# Patient Record
Sex: Male | Born: 1970 | Race: White | Hispanic: No | Marital: Married | Smoking: Never smoker
Health system: Southern US, Community
[De-identification: ages and names within clinical notes are randomized; demographics above are authoritative.]

## PROBLEM LIST (undated history)

## (undated) HISTORY — PX: APPENDECTOMY: SHX54

---

## 2008-03-17 ENCOUNTER — Ambulatory Visit: Payer: Self-pay | Admitting: Internal Medicine

## 2008-04-23 ENCOUNTER — Ambulatory Visit: Payer: Self-pay | Admitting: Family Medicine

## 2008-05-04 ENCOUNTER — Emergency Department: Payer: Self-pay | Admitting: Emergency Medicine

## 2009-04-30 IMAGING — CR DG CHEST 1V PORT
1 series · 1 of 1 positions shown · non-contrast
Comparison: none

REASON FOR EXAM: Chest Pain
COMMENTS:

PROCEDURE:     DXR - DXR PORTABLE CHEST SINGLE VIEW  - May 04, 2008 [DATE]
RESULT:     The lungs are adequately inflated and clear. The heart is normal
in size. The pulmonary vascularity is not engorged. I see no pleural
effusion. The mediastinum is not widened.

[view not recorded]
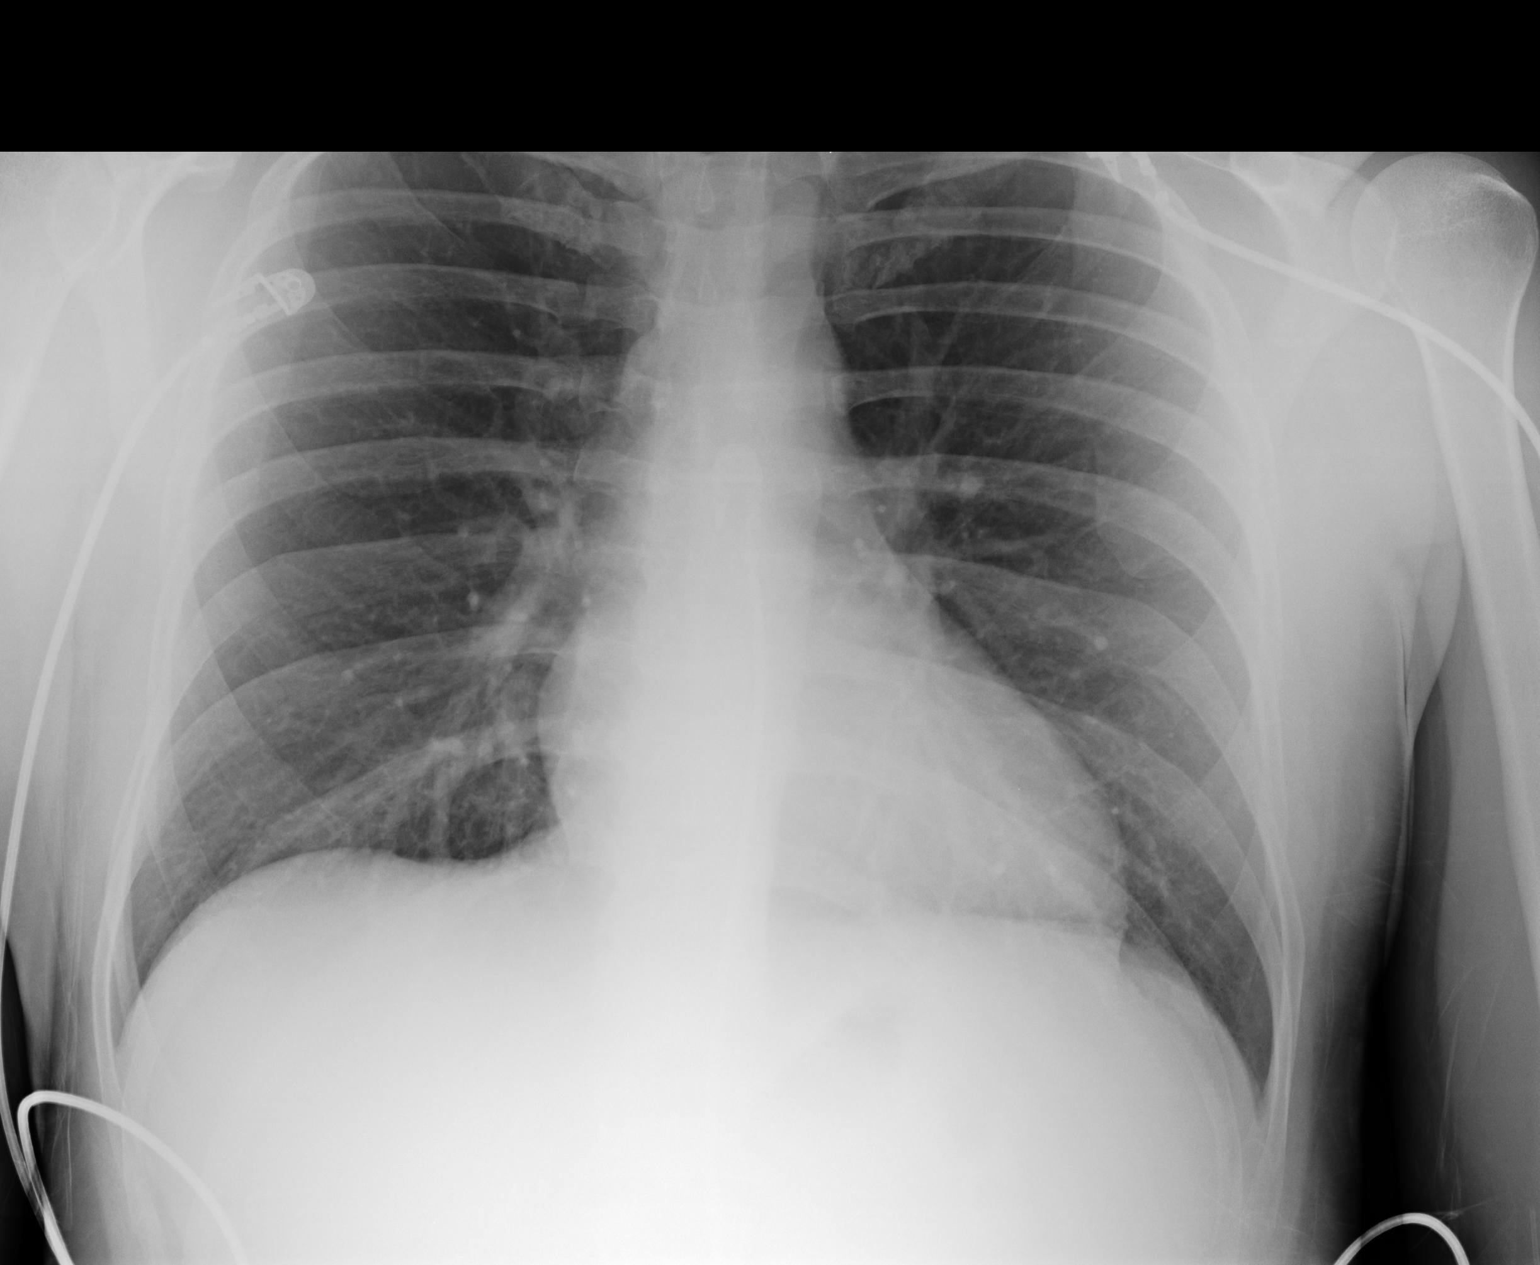

[1 of 1 positions shown; findings below may reference images not displayed]

IMPRESSION: I do not see evidence of acute cardiopulmonary abnormality. A followup PA
and lateral chest x-ray would be of value if the patient's chest pain
persists.

## 2017-04-19 ENCOUNTER — Ambulatory Visit
Admission: EM | Admit: 2017-04-19 | Discharge: 2017-04-19 | Disposition: A | Discharge status: Home / Self Care | Payer: BLUE CROSS/BLUE SHIELD | Attending: Family Medicine | Admitting: Family Medicine

## 2017-04-19 DIAGNOSIS — J111 Influenza due to unidentified influenza virus with other respiratory manifestations: Secondary | ICD-10-CM

## 2017-04-19 DIAGNOSIS — R51 Headache: Secondary | ICD-10-CM

## 2017-04-19 DIAGNOSIS — R509 Fever, unspecified: Secondary | ICD-10-CM | POA: Diagnosis not present

## 2017-04-19 DIAGNOSIS — R110 Nausea: Secondary | ICD-10-CM | POA: Diagnosis not present

## 2017-04-19 LAB — URINALYSIS, COMPLETE (UACMP) WITH MICROSCOPIC
BILIRUBIN URINE: NEGATIVE
Bacteria, UA: NONE SEEN
GLUCOSE, UA: NEGATIVE mg/dL
HGB URINE DIPSTICK: NEGATIVE
LEUKOCYTES UA: NEGATIVE
Nitrite: NEGATIVE
PROTEIN: NEGATIVE mg/dL
SQUAMOUS EPITHELIAL / LPF: NONE SEEN
Specific Gravity, Urine: 1.015 (ref 1.005–1.030)
WBC UA: NONE SEEN WBC/hpf (ref 0–5)
pH: 7 (ref 5.0–8.0)

## 2017-04-19 LAB — RAPID INFLUENZA A&B ANTIGENS: Influenza B (ARMC): NEGATIVE

## 2017-04-19 LAB — RAPID INFLUENZA A&B ANTIGENS (ARMC ONLY): INFLUENZA A (ARMC): NEGATIVE

## 2017-04-19 MED ORDER — OSELTAMIVIR PHOSPHATE 75 MG PO CAPS: 75.0000 mg | ORAL_CAPSULE | Freq: Two times a day (BID) | ORAL | 0 refills | Status: AC

## 2017-04-19 NOTE — ED Provider Notes (Signed)
[facility]    CSN: [id number] Arrival date & time: 04/19/17  [id number]   History   Chief Complaint Chief Complaint  Patient presents with  . Fever   HPI  46 year old male presents with fever, bodyaches, chills.  Patient states that symptoms started Thursday, before lunch. Sudden onset subjective fever, chills, body aches. Associated decreased appetite and nausea. Also has had headache. Has been taking Tylenol and Advil without improvement. No reported sick contacts. No known exacerbating factors. No other associated symptoms. He states his symptoms are severe. He has no other complaints or concerns at this time.  PMH - Allergies, HLD.  Surgical Hx - Sinus surgery x2, appendectomy.   Home Medications    Prior to Admission medications   Medication Sig Start Date End Date Taking? Authorizing Provider  aspirin 81 MG chewable tablet Chew by mouth daily.   Yes [provider]  fluticasone (FLONASE) 50 MCG/ACT nasal spray Place into both nostrils daily.   Yes [provider]  montelukast (SINGULAIR) 10 MG tablet Take 10 mg by mouth at bedtime.   Yes [provider]  rosuvastatin (CRESTOR) 20 MG tablet Take 20 mg by mouth daily.   Yes [provider]  oseltamivir (TAMIFLU) 75 MG capsule Take 1 capsule (75 mg total) by mouth every 12 (twelve) hours. 04/19/17   Tommie Samsook, Majestic Brister G, DO    Family History Dad - CAD, Stroke, DM-2, HTN Mother - Stroke, HTN, DM-2, Breast Cancer  Social History Social History  Substance Use Topics  . Smoking status: Never Smoker  . Smokeless tobacco: Never Used  . Alcohol use No   Allergies   Penicillins  Review of Systems Review of Systems  Constitutional: Positive for chills and fever.  Gastrointestinal: Positive for nausea.       Anorexia.  Neurological: Positive for headaches.  All other systems reviewed and are negative.  Physical Exam Triage Vital Signs ED Triage Vitals  Enc Vitals Group     BP  04/19/17 0949 133/65     Pulse Rate 04/19/17 0949 87     Resp 04/19/17 0949 18     Temp 04/19/17 0949 (!) 102.7 F (39.3 C)     Temp Source 04/19/17 0949 Oral     SpO2 04/19/17 0949 96 %     Weight 04/19/17 0943 171 lb 4.8 oz (77.7 kg)     Height --      Head Circumference --      Peak Flow --      Pain Score 04/19/17 0944 5     Pain Loc --      Pain Edu? --      Excl. in GC? --    Updated Vital Signs BP 133/65 (BP Location: Left Arm)   Pulse 87   Temp (!) 102.7 F (39.3 C) (Oral)   Resp 18   Wt 171 lb 4.8 oz (77.7 kg)   SpO2 96%   Physical Exam  Constitutional: He is oriented to person, place, and time. He appears well-developed and well-nourished. No distress.  HENT:  Head: Normocephalic and atraumatic.  Nose: Nose normal.  Mouth/Throat: Oropharynx is clear and moist. No oropharyngeal exudate.  Normal TM's bilaterally.   Eyes: Conjunctivae are normal. No scleral icterus.  Neck: Neck supple. No thyromegaly present.  Cardiovascular: Normal rate and regular rhythm.   No murmur heard. Pulmonary/Chest: Effort normal and breath sounds normal. He has no wheezes. He has no rales.  Abdominal: Soft. He exhibits no distension. There  is no tenderness. There is no rebound and no guarding.  Musculoskeletal: Normal range of motion. He exhibits no edema.  Lymphadenopathy:    He has no cervical adenopathy.  Neurological: He is alert and oriented to person, place, and time.  Skin: Skin is warm and dry. No rash noted.  Psychiatric: He has a normal mood and affect.  Vitals reviewed.  UC Treatments / Results  Labs (all labs ordered are listed, but only abnormal results are displayed) Labs Reviewed  URINALYSIS, COMPLETE (UACMP) WITH MICROSCOPIC - Abnormal; Notable for the following:       Result Value   Ketones, ur TRACE (*)    All other components within normal limits  RAPID INFLUENZA A&B ANTIGENS ([facility] ONLY)    EKG  EKG Interpretation None      Radiology No results  found.  Procedures Procedures (including critical care time)  Medications Ordered in UC Medications - No data to display   Initial Impression / Assessment and Plan / UC Course  I have reviewed the triage vital signs and the nursing notes.  Pertinent labs & imaging results that were available during my care of the patient were reviewed by me and considered in my medical decision making (see chart for details).     46 year old male presents with sinus symptoms of influenza. His test is negative. However, clinically he appears to have influenza. Treating with Tamiflu.  Final Clinical Impressions(s) / UC Diagnoses   Final diagnoses:  Influenza   New Prescriptions Discharge Medication List as of 04/19/2017 10:41 AM    START taking these medications   Details  oseltamivir (TAMIFLU) 75 MG capsule Take 1 capsule (75 mg total) by mouth every 12 (twelve) hours., Starting Sat 04/19/2017, Print       Controlled Substance Prescriptions  Controlled Substance Registry consulted? Not Applicable   Tommie Sams, DO 04/19/17 1054

## 2017-04-19 NOTE — ED Triage Notes (Signed)
Pt has been feeling bad for 3 days and feels like he has a fever but didn't take his temp. Body chills, body aches, feeling bad, loss of appetite and nausea and headache. Has been taking Advil and tylenol.

## 2022-12-20 ENCOUNTER — Other Ambulatory Visit: Payer: Self-pay | Admitting: Internal Medicine

## 2022-12-20 DIAGNOSIS — Z0000 Encounter for general adult medical examination without abnormal findings: Secondary | ICD-10-CM

## 2022-12-20 DIAGNOSIS — Z8249 Family history of ischemic heart disease and other diseases of the circulatory system: Secondary | ICD-10-CM

## 2022-12-20 DIAGNOSIS — E782 Mixed hyperlipidemia: Secondary | ICD-10-CM

## 2023-01-03 ENCOUNTER — Ambulatory Visit
Admission: RE | Admit: 2023-01-03 | Discharge: 2023-01-03 | Disposition: A | Discharge status: Home / Self Care | Payer: BC Managed Care – PPO | Source: Ambulatory Visit | Attending: Internal Medicine | Admitting: Internal Medicine

## 2023-01-03 DIAGNOSIS — E782 Mixed hyperlipidemia: Secondary | ICD-10-CM | POA: Insufficient documentation

## 2023-01-03 DIAGNOSIS — Z8249 Family history of ischemic heart disease and other diseases of the circulatory system: Secondary | ICD-10-CM

## 2023-01-03 DIAGNOSIS — Z0000 Encounter for general adult medical examination without abnormal findings: Secondary | ICD-10-CM
